# Patient Record
Sex: Male | Born: 1960 | Race: Black or African American | Hispanic: No | Marital: Single
Health system: Midwestern US, Community
[De-identification: ages and names within clinical notes are randomized; demographics above are authoritative.]

## PROBLEM LIST (undated history)

## (undated) DIAGNOSIS — I10 Essential (primary) hypertension: Secondary | ICD-10-CM

---

## 2015-03-29 ENCOUNTER — Ambulatory Visit: Admit: 2015-03-29 | Discharge: 2015-03-29 | Attending: Internal Medicine

## 2015-03-29 DIAGNOSIS — I10 Essential (primary) hypertension: Secondary | ICD-10-CM

## 2015-03-29 LAB — AMB POC LIPID PROFILE
Cholesterol (POC): 147
HDL Cholesterol (POC): 26
LDL Cholesterol (POC): 66
Non-HDL Goal (POC): 121
TChol/HDL Ratio (POC): 5.7
Triglycerides (POC): 275

## 2015-03-29 LAB — AMB POC GLUCOSE BLOOD, BY GLUCOSE MONITORING DEVICE: Glucose POC: 114 mg/dL

## 2015-03-29 MED ORDER — HYDROCHLOROTHIAZIDE 25 MG TAB
25 mg | ORAL_TABLET | Freq: Every day | ORAL | 12 refills | Status: AC
Start: 2015-03-29 — End: ?

## 2015-03-29 MED ORDER — AMLODIPINE 5 MG TAB
5 mg | ORAL_TABLET | Freq: Every day | ORAL | 12 refills | Status: AC
Start: 2015-03-29 — End: ?

## 2015-03-29 NOTE — Patient Instructions (Signed)
MyChart Activation    Thank you for requesting access to MyChart. Please follow the instructions below to securely access and download your online medical record. MyChart allows you to send messages to your doctor, view your test results, renew your prescriptions, schedule appointments, and more.    How Do I Sign Up?    1. In your internet browser, go to [email or web address]  2. Click on the First Time User? Click Here link in the Sign In box. You will be redirect to the New Member Sign Up page.  3. Enter your MyChart Access Code exactly as it appears below. You will not need to use this code after you???ve completed the sign-up process. If you do not sign up before the expiration date, you must request a new code.    MyChart Access Code: [email or web address]  Expires: 06/27/2015  8:20 AM (This is the date your MyChart access code will expire)    4. Enter the last four digits of your Social Security Number (xxxx) and Date of Birth (mm/dd/yyyy) as indicated and click Submit. You will be taken to the next sign-up page.  5. Create a MyChart ID. This will be your MyChart login ID and cannot be changed, so think of one that is secure and easy to remember.  6. Create a MyChart password. You can change your password at any time.  7. Enter your Password Reset Question and Answer. This can be used at a later time if you forget your password.   8. Enter your e-mail address. You will receive e-mail notification when new information is available in MyChart.  9. Click Sign Up. You can now view and download portions of your medical record.  10. Click the Download Summary menu link to download a portable copy of your medical information.    Additional Information    If you have questions, please visit the Frequently Asked Questions section of the MyChart website at [email or web address] Remember, MyChart is NOT to be used for urgent needs. For medical emergencies, dial 911.

## 2015-03-29 NOTE — Progress Notes (Signed)
Alexander Sellers is a 54 y.o. male and presents with Establish Care and Hypertension  .  Subjective:  Hypertension Review:  The patient has hypertension,he has headaches after taking lisinopril.  Diet and Lifestyle: generally follows a  low sodium diet, exercises sporadically  Home BP Monitoring: is not measured at home.  Pertinent ROS: taking medications as instructed, no medication side effects noted, no TIA's, no chest pain on exertion, no dyspnea on exertion, no swelling of ankles.         Review of Systems  Constitutional: negative for fevers, chills, anorexia and weight loss  Eyes:   negative for visual disturbance and irritation  ENT:   negative for tinnitus,sore throat,nasal congestion,ear pains.hoarseness  Respiratory:  negative for cough, hemoptysis, dyspnea,wheezing  CV:   negative for chest pain, palpitations, lower extremity edema  GI:   negative for nausea, vomiting, diarrhea, abdominal pain,melena  Endo:               negative for polyuria,polydipsia,polyphagia,heat intolerance  Genitourinary: negative for frequency, dysuria and hematuria  Integument:  negative for rash and pruritus  Hematologic:  negative for easy bruising and gum/nose bleeding  Musculoskel: negative for myalgias, arthralgias, back pain, muscle weakness, joint pain  Neurological:  negative for, dizziness, vertigo, memory problems and gait   Behavl/Psych: negative for feelings of anxiety, depression, mood changes    Past Medical History   Diagnosis Date   ??? Hypertension      History reviewed. No pertinent past surgical history.  Social History     Social History   ??? Marital status: SINGLE     Spouse name: N/A   ??? Number of children: N/A   ??? Years of education: N/A     Social History Main Topics   ??? Smoking status: Current Every Day Smoker   ??? Smokeless tobacco: None   ??? Alcohol use Yes      Comment: occ   ??? Drug use: None   ??? Sexual activity: Yes     Partners: Female     Other Topics Concern   ??? None     Social History Narrative    ??? None     Family History   Problem Relation Age of Onset   ??? Diabetes Mother      Current Outpatient Prescriptions   Medication Sig Dispense Refill   ??? amLODIPine (NORVASC) 5 mg tablet Take 1 Tab by mouth daily. 30 Tab 12   ??? hydrochlorothiazide (HYDRODIURIL) 25 mg tablet Take 1 Tab by mouth daily. 30 Tab 12     Not on File    Objective:  Visit Vitals   ??? BP 160/90   ??? Pulse 68   ??? Temp 98 ??F (36.7 ??C) (Oral)   ??? Resp 16   ??? Ht 6\' 2"  (1.88 m)   ??? Wt 274 lb (124.3 kg)   ??? SpO2 96%   ??? BMI 35.18 kg/m2     Physical Exam:   General appearance - alert, well appearing, and in no distress  Mental status - alert, oriented to person, place, and time  EYE-PERLA, EOMI, corneas normal, no foreign bodies  ENT-ENT exam normal, no neck nodes or sinus tenderness  Nose - normal and patent, no erythema, discharge or polyps  Mouth - mucous membranes moist, pharynx normal without lesions  Neck - supple, no significant adenopathy   Chest - clear to auscultation, no wheezes, rales or rhonchi, symmetric air entry   Heart - normal rate, regular rhythm,  normal S1, S2, no murmurs, rubs, clicks or gallops   Abdomen - soft, nontender, nondistended, no masses or organomegaly  Lymph- no adenopathy palpable  Ext-peripheral pulses normal, no pedal edema, no clubbing or cyanosis  Skin-Warm and dry. no hyperpigmentation, vitiligo, or suspicious lesions  Neuro -alert, oriented, normal speech, no focal findings or movement disorder noted  Neck-normal C-spine, no tenderness, full ROM without pain      Results for orders placed or performed in visit on 03/29/15   AMB POC GLUCOSE BLOOD, BY GLUCOSE MONITORING DEVICE   Result Value Ref Range    Glucose POC 114 mg/dL   AMB POC LIPID PROFILE   Result Value Ref Range    Cholesterol (POC) 147     Triglycerides (POC) 275     HDL Cholesterol (POC) 26     LDL Cholesterol (POC) 66     Non-HDL Goal (POC) 121     TChol/HDL Ratio (POC) 5.7        Assessment/Plan:    ICD-10-CM ICD-9-CM     1. Essential hypertension with goal blood pressure less than 140/90 I10 401.9    2. Establishing care with new doctor, encounter for Z71.89 V65.8 CBC W/O DIFF      METABOLIC PANEL, COMPREHENSIVE      AMB POC GLUCOSE BLOOD, BY GLUCOSE MONITORING DEVICE      AMB POC LIPID PROFILE     Orders Placed This Encounter   ??? CBC W/O DIFF   ??? METABOLIC PANEL, COMPREHENSIVE   ??? AMB POC GLUCOSE BLOOD, BY GLUCOSE MONITORING DEVICE   ??? AMB POC LIPID PROFILE   ??? DISCONTD: lisinopril (PRINIVIL, ZESTRIL) 10 mg tablet     Sig: Take  by mouth daily.   ??? amLODIPine (NORVASC) 5 mg tablet     Sig: Take 1 Tab by mouth daily.     Dispense:  30 Tab     Refill:  12   ??? hydrochlorothiazide (HYDRODIURIL) 25 mg tablet     Sig: Take 1 Tab by mouth daily.     Dispense:  30 Tab     Refill:  12     lose weight, increase physical activity, follow low fat diet, follow low salt diet, continue present plan,Take  aspirin daily  Patient Instructions   MyChart Activation    Thank you for requesting access to MyChart. Please follow the instructions below to securely access and download your online medical record. MyChart allows you to send messages to your doctor, view your test results, renew your prescriptions, schedule appointments, and more.    How Do I Sign Up?    1. In your internet browser, go to [email or web address]  2. Click on the First Time User? Click Here link in the Sign In box. You will be redirect to the New Member Sign Up page.  3. Enter your MyChart Access Code exactly as it appears below. You will not need to use this code after you???ve completed the sign-up process. If you do not sign up before the expiration date, you must request a new code.    MyChart Access Code: [email or web address]  Expires: 06/27/2015  8:20 AM (This is the date your MyChart access code will expire)    4. Enter the last four digits of your Social Security Number (xxxx) and Date of Birth (mm/dd/yyyy) as indicated and click Submit. You will be  taken to the next sign-up page.  5. Create a MyChart ID. This will be your MyChart login ID and cannot be  changed, so think of one that is secure and easy to remember.  6. Create a MyChart password. You can change your password at any time.  7. Enter your Password Reset Question and Answer. This can be used at a later time if you forget your password.   8. Enter your e-mail address. You will receive e-mail notification when new information is available in MyChart.  9. Click Sign Up. You can now view and download portions of your medical record.  10. Click the Download Summary menu link to download a portable copy of your medical information.    Additional Information    If you have questions, please visit the Frequently Asked Questions section of the MyChart website at [email or web address] Remember, MyChart is NOT to be used for urgent needs. For medical emergencies, dial 911.           Follow-up Disposition:  Return in about 4 weeks (around 04/26/2015), or if symptoms worsen or fail to improve.      I have reviewed with the patient details of the assessment and plan and all questions were answered. Relevent patient education was performed    An After Visit Summary was printed and given to the patient.

## 2015-03-30 LAB — METABOLIC PANEL, COMPREHENSIVE
A-G Ratio: 1.2 (ref 1.1–2.5)
ALT (SGPT): 64 IU/L — ABNORMAL HIGH (ref 0–44)
AST (SGOT): 59 IU/L — ABNORMAL HIGH (ref 0–40)
Albumin: 4 g/dL (ref 3.5–5.5)
Alk. phosphatase: 102 IU/L (ref 39–117)
BUN/Creatinine ratio: 14 (ref 9–20)
BUN: 17 mg/dL (ref 6–24)
Bilirubin, total: 0.4 mg/dL (ref 0.0–1.2)
CO2: 29 mmol/L (ref 18–29)
Calcium: 8.9 mg/dL (ref 8.7–10.2)
Chloride: 101 mmol/L (ref 97–108)
Creatinine: 1.23 mg/dL (ref 0.76–1.27)
GFR est AA: 77 mL/min/{1.73_m2} (ref >59)
GFR est non-AA: 67 mL/min/{1.73_m2} (ref >59)
GLOBULIN, TOTAL: 3.3 g/dL (ref 1.5–4.5)
Glucose: 109 mg/dL — ABNORMAL HIGH (ref 65–99)
Potassium: 4.4 mmol/L (ref 3.5–5.2)
Protein, total: 7.3 g/dL (ref 6.0–8.5)
Sodium: 141 mmol/L (ref 134–144)

## 2015-03-30 LAB — CBC W/O DIFF
HCT: 47.7 % (ref 37.5–51.0)
HGB: 16.1 g/dL (ref 12.6–17.7)
MCH: 33.1 pg — ABNORMAL HIGH (ref 26.6–33.0)
MCHC: 33.8 g/dL (ref 31.5–35.7)
MCV: 98 fL — ABNORMAL HIGH (ref 79–97)
PLATELET: 133 10*3/uL — ABNORMAL LOW (ref 150–379)
RBC: 4.87 x10E6/uL (ref 4.14–5.80)
RDW: 13.9 % (ref 12.3–15.4)
WBC: 5.6 10*3/uL (ref 3.4–10.8)

## 2015-05-01 ENCOUNTER — Encounter: Attending: Internal Medicine

## 2015-06-13 ENCOUNTER — Ambulatory Visit: Admit: 2015-06-13 | Discharge: 2015-06-13 | Attending: Internal Medicine

## 2015-06-13 DIAGNOSIS — M12812 Other specific arthropathies, not elsewhere classified, left shoulder: Secondary | ICD-10-CM

## 2015-06-13 MED ORDER — TADALAFIL 20 MG TABLET
20 mg | ORAL_TABLET | ORAL | 12 refills | Status: DC | PRN
Start: 2015-06-13 — End: 2015-06-13

## 2015-06-13 MED ORDER — TRIAMCINOLONE ACETONIDE 40 MG/ML SUSP FOR INJECTION
40 mg/mL | Freq: Once | INTRAMUSCULAR | 0 refills | Status: AC
Start: 2015-06-13 — End: 2015-06-13

## 2015-06-13 MED ORDER — TADALAFIL 20 MG TABLET
20 mg | ORAL_TABLET | ORAL | 12 refills | Status: AC | PRN
Start: 2015-06-13 — End: ?

## 2015-06-13 NOTE — Patient Instructions (Signed)
MyChart Activation    Thank you for requesting access to MyChart. Please follow the instructions below to securely access and download your online medical record. MyChart allows you to send messages to your doctor, view your test results, renew your prescriptions, schedule appointments, and more.    How Do I Sign Up?    1. In your internet browser, go to [email or web address]  2. Click on the First Time User? Click Here link in the Sign In box. You will be redirect to the New Member Sign Up page.  3. Enter your MyChart Access Code exactly as it appears below. You will not need to use this code after you???ve completed the sign-up process. If you do not sign up before the expiration date, you must request a new code.    MyChart Access Code: [email or web address]  Expires: 06/27/2015  7:20 AM (This is the date your MyChart access code will expire)    4. Enter the last four digits of your Social Security Number (xxxx) and Date of Birth (mm/dd/yyyy) as indicated and click Submit. You will be taken to the next sign-up page.  5. Create a MyChart ID. This will be your MyChart login ID and cannot be changed, so think of one that is secure and easy to remember.  6. Create a MyChart password. You can change your password at any time.  7. Enter your Password Reset Question and Answer. This can be used at a later time if you forget your password.   8. Enter your e-mail address. You will receive e-mail notification when new information is available in MyChart.  9. Click Sign Up. You can now view and download portions of your medical record.  10. Click the Download Summary menu link to download a portable copy of your medical information.    Additional Information    If you have questions, please visit the Frequently Asked Questions section of the MyChart website at [email or web address] Remember, MyChart is NOT to be used for urgent needs. For medical emergencies, dial 911.

## 2015-06-13 NOTE — Progress Notes (Signed)
Alexander Sellers is a 54 y.o. male and presents with Results and Labs  .  Subjective:  Hypertension Review:  The patient has hypertension  Diet and Lifestyle: generally follows a  low sodium diet, exercises sporadically  Home BP Monitoring: is not measured at home.  Pertinent ROS: taking medications as instructed, no medication side effects noted, no TIA's, no chest pain on exertion, no dyspnea on exertion, no swelling of ankles.     His labs revealed elevated liver enzyme elevation.    Erectile dysfunction Review::  Patient complains of difficulty in maintaining an adequate erection.  He states that his present medication is helping thus far.    Shoulder Pain Review:  Patient complains of left side shoulder pain. The symptoms began months ago Course of symptoms since onset has been gradually worsening. Pain is described as overall severity = moderate. Symptoms were incited by no known event.  Patient denies N/A. Therapy to date includes OTC analgesics: effective.      Review of Systems  Constitutional: negative for fevers, chills, anorexia and weight loss  Eyes:   negative for visual disturbance and irritation  ENT:   negative for tinnitus,sore throat,nasal congestion,ear pains.hoarseness  Respiratory:  negative for cough, hemoptysis, dyspnea,wheezing  CV:   negative for chest pain, palpitations, lower extremity edema  GI:   negative for nausea, vomiting, diarrhea, abdominal pain,melena  Endo:               negative for polyuria,polydipsia,polyphagia,heat intolerance  Genitourinary: negative for frequency, dysuria and hematuria  Integument:  negative for rash and pruritus  Hematologic:  negative for easy bruising and gum/nose bleeding  Musculoskel:  myalgias,joint pain  Neurological:  negative for headaches, dizziness, vertigo, memory problems and gait   Behavl/Psych: negative for feelings of anxiety, depression, mood changes    Past Medical History   Diagnosis Date   ??? Hypertension       History reviewed. No pertinent past surgical history.  Social History     Social History   ??? Marital status: SINGLE     Spouse name: N/A   ??? Number of children: N/A   ??? Years of education: N/A     Social History Main Topics   ??? Smoking status: Current Every Day Smoker   ??? Smokeless tobacco: None   ??? Alcohol use Yes      Comment: occ   ??? Drug use: None   ??? Sexual activity: Yes     Partners: Female     Other Topics Concern   ??? None     Social History Narrative     Family History   Problem Relation Age of Onset   ??? Diabetes Mother      Current Outpatient Prescriptions   Medication Sig Dispense Refill   ??? tadalafil (CIALIS) 20 mg tablet Take 1 Tab by mouth as needed. 10 Tab 12   ??? triamcinolone acetonide (KENALOG-40) 40 mg/mL injection 1 mL by IntraBURSal route once for 1 dose. 1 Vial 0   ??? amLODIPine (NORVASC) 5 mg tablet Take 1 Tab by mouth daily. 30 Tab 12   ??? hydrochlorothiazide (HYDRODIURIL) 25 mg tablet Take 1 Tab by mouth daily. 30 Tab 12     Not on File    Objective:  Visit Vitals   ??? BP 130/82   ??? Pulse 69   ??? Temp 98 ??F (36.7 ??C) (Oral)   ??? Resp 16   ??? Ht '6\' 2"'  (1.88 m)   ??? Wt 291  lb (132 kg)   ??? SpO2 95%   ??? BMI 37.36 kg/m2     Physical Exam:   General appearance - alert, well appearing, and in no distress  Mental status - alert, oriented to person, place, and time  EYE-PERLA, EOMI, corneas normal, no foreign bodies  ENT-ENT exam normal, no neck nodes or sinus tenderness  Nose - normal and patent, no erythema, discharge or polyps  Mouth - mucous membranes moist, pharynx normal without lesions  Neck - supple, no significant adenopathy   Chest - clear to auscultation, no wheezes, rales or rhonchi, symmetric air entry   Heart - normal rate, regular rhythm, normal S1, S2, no murmurs, rubs, clicks or gallops   Abdomen - soft, nontender, nondistended, no masses or organomegaly  Lymph- no adenopathy palpable  Ext-peripheral pulses normal, no pedal edema, no clubbing or cyanosis   Skin-Warm and dry. no hyperpigmentation, vitiligo, or suspicious lesions  Neuro -alert, oriented, normal speech, no focal findings or movement disorder noted  Neck-normal C-spine, no tenderness, full ROM without pain  Lt.shoulder-reduced range of motion of lt., subdeltoid tenderness    Results for orders placed or performed in visit on 03/29/15   CBC W/O DIFF   Result Value Ref Range    WBC 5.6 3.4 - 10.8 x10E3/uL    RBC 4.87 4.14 - 5.80 x10E6/uL    HGB 16.1 12.6 - 17.7 g/dL    HCT 47.7 37.5 - 51.0 %    MCV 98 (H) 79 - 97 fL    MCH 33.1 (H) 26.6 - 33.0 pg    MCHC 33.8 31.5 - 35.7 g/dL    RDW 13.9 12.3 - 15.4 %    PLATELET 133 (L) 150 - 379 G31D1/VO   METABOLIC PANEL, COMPREHENSIVE   Result Value Ref Range    Glucose 109 (H) 65 - 99 mg/dL    BUN 17 6 - 24 mg/dL    Creatinine 1.23 0.76 - 1.27 mg/dL    GFR est non-AA 67 >59 mL/min/1.73    GFR est AA 77 >59 mL/min/1.73    BUN/Creatinine ratio 14 9 - 20    Sodium 141 134 - 144 mmol/L    Potassium 4.4 3.5 - 5.2 mmol/L    Chloride 101 97 - 108 mmol/L    CO2 29 18 - 29 mmol/L    Calcium 8.9 8.7 - 10.2 mg/dL    Protein, total 7.3 6.0 - 8.5 g/dL    Albumin 4.0 3.5 - 5.5 g/dL    GLOBULIN, TOTAL 3.3 1.5 - 4.5 g/dL    A-G Ratio 1.2 1.1 - 2.5    Bilirubin, total 0.4 0.0 - 1.2 mg/dL    Alk. phosphatase 102 39 - 117 IU/L    AST 59 (H) 0 - 40 IU/L    ALT 64 (H) 0 - 44 IU/L   AMB POC GLUCOSE BLOOD, BY GLUCOSE MONITORING DEVICE   Result Value Ref Range    Glucose POC 114 mg/dL   AMB POC LIPID PROFILE   Result Value Ref Range    Cholesterol (POC) 147     Triglycerides (POC) 275     HDL Cholesterol (POC) 26     LDL Cholesterol (POC) 66     Non-HDL Goal (POC) 121     TChol/HDL Ratio (POC) 5.7        Assessment/Plan:    ICD-10-CM ICD-9-CM    1. Rotator cuff arthropathy, left M12.812 716.81 PR DRAIN/INJECT INTERMEDIATE JOINT/BURSA      triamcinolone  acetonide (KENALOG-40) 40 mg/mL injection   2. Screen for colon cancer Z12.11 V76.51 OCCULT BLOOD, STOOL    3. Essential hypertension I10 401.9    4. Elevated liver enzymes R74.8 790.5 HEPATITIS C AB   5. 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 OCCULT BLOOD, STOOL   ??? HEPATITIS C AB   ??? PR DRAIN/INJECT INTERMEDIATE JOINT/BURSA   ??? DISCONTD: tadalafil (CIALIS) 20 mg tablet     Sig: Take 1 Tab by mouth as needed.     Dispense:  10 Tab     Refill:  12   ??? tadalafil (CIALIS) 20 mg tablet     Sig: Take 1 Tab by mouth as needed.     Dispense:  10 Tab     Refill:  12   ??? triamcinolone acetonide (KENALOG-40) 40 mg/mL injection     Sig: 1 mL by IntraBURSal route once for 1 dose.     Dispense:  1 Vial     Refill:  0     lose weight, increase physical activity, follow low fat diet, follow low salt diet,Take 72m aspirin daily  Indications:   Symptomatic relief of pain    Procedure:  After consent was obtained, using sterile technique the left shoulder joint was prepped using alcohol. Local anesthetic used: 1% lidocaine.. The joint was entered and Kenalog 40 mg was mixed with 1% lidocaine 3 ml  and injected into the joint and the needle withdrawn.  The procedure was well tolerated.  The patient is asked to continue to rest the joint for a few more days before resuming regular activities.  It may be more painful for the first 1-2 days.  Watch for fever, or increased swelling or persistent pain in the joint. Call or return to clinic prn if such symptoms occur or there is failure to improve as anticipated.      [facility]  OFFICE PROCEDURE PROGRESS NOTE        Chart reviewed for the following:   I, HHelayne Seminole, MD, have reviewed the History, Physical and [facility]    TIME OUT performed immediately prior to start of procedure:   I, HHelayne Seminole, MD, have performed the following reviews on ZBobbye Mortonprior to the start of the procedure:             * Patient was identified by name and date of birth   * Agreement on procedure being performed was verified  * Risks and Benefits explained to the patient  * Procedure site verified and marked as necessary  * Patient was positioned for comfort       Time: 3:13pm      Date of procedure: 06/13/2015    Procedure performed by:  HHelayne Seminole, MD    Patient assisted by: self    How tolerated by patient: tolerated the procedure well with no complications    Comments: none                Patient Instructions   MyChart Activation    Thank you for requesting access to MyChart. Please follow the instructions below to securely access and download your online medical record. MyChart allows you to send messages to your doctor, view your test results, renew your prescriptions, schedule appointments, and more.  How Do I Sign Up?    1. In your internet browser, go to [email or web address]  2. Click on the First Time User? Click Here link in the Sign In box. You will be redirect to the New Member Sign Up page.  3. Enter your MyChart Access Code exactly as it appears below. You will not need to use this code after you???ve completed the sign-up process. If you do not sign up before the expiration date, you must request a new code.    MyChart Access Code: [email or web address]  Expires: 06/27/2015  7:20 AM (This is the date your MyChart access code will expire)    4. Enter the last four digits of your Social Security Number (xxxx) and Date of Birth (mm/dd/yyyy) as indicated and click Submit. You will be taken to the next sign-up page.  5. Create a MyChart ID. This will be your MyChart login ID and cannot be changed, so think of one that is secure and easy to remember.  6. Create a MyChart password. You can change your password at any time.  7. Enter your Password Reset Question and Answer. This can be used at a later time if you forget your password.   8. Enter your e-mail address. You will receive e-mail notification when  new information is available in [place].  9. Click Sign Up. You can now view and download portions of your medical record.  10. Click the Download Summary menu link to download a portable copy of your medical information.    Additional Information    If you have questions, please visit the Frequently Asked Questions section of the MyChart website at [email or web address] Remember, MyChart is NOT to be used for urgent needs. For medical emergencies, dial 911.           Follow-up Disposition:  Return in about 4 weeks (around 07/11/2015), or if symptoms worsen or fail to improve.      I have reviewed with the patient details of the assessment and plan and all questions were answered. Relevent patient education was performed    An After Visit Summary was printed and given to the patient.

## 2015-06-13 NOTE — Addendum Note (Signed)
Addended by: Wyvonnia LoraWYATT, Madell Heino D on: 06/13/2015 04:28 PM      Modules accepted: Orders

## 2015-06-14 LAB — HEPATITIS C ANTIBODY: HCV Ab: >11 s/co ratio — ABNORMAL HIGH (ref 0.0–0.9)

## 2015-06-14 LAB — HEPATITIS C AB: Hep C Virus Ab: >11 s/co ratio — ABNORMAL HIGH (ref 0.0–0.9)

## 2015-06-16 LAB — LIPID PANEL
Cholesterol, total: 143 mg/dL (ref 100–199)
HDL Cholesterol: 13 mg/dL — ABNORMAL LOW (ref >39)
Triglyceride: 732 mg/dL — CR (ref 0–149)

## 2015-06-16 LAB — SPECIMEN STATUS REPORT

## 2015-06-16 LAB — CVD REPORT

## 2015-06-26 LAB — OCCULT BLOOD IMMUNOASSAY,DIAGNOSTIC: Occult blood fecal, by IA: NEGATIVE

## 2015-08-24 ENCOUNTER — Encounter: Attending: Internal Medicine

## 2016-06-19 ENCOUNTER — Emergency Department
Admission: EM | Admit: 2016-06-19 | Discharge: 2016-06-19 | Disposition: A | Discharge status: Home / Self Care | Payer: 59 | Attending: Emergency Medicine | Admitting: Emergency Medicine

## 2016-06-19 ENCOUNTER — Encounter: Payer: Self-pay | Admitting: *Deleted

## 2016-06-19 ENCOUNTER — Emergency Department: Payer: 59

## 2016-06-19 DIAGNOSIS — Y939 Activity, unspecified: Secondary | ICD-10-CM | POA: Insufficient documentation

## 2016-06-19 DIAGNOSIS — Y999 Unspecified external cause status: Secondary | ICD-10-CM | POA: Insufficient documentation

## 2016-06-19 DIAGNOSIS — S9032XA Contusion of left foot, initial encounter: Secondary | ICD-10-CM | POA: Diagnosis not present

## 2016-06-19 DIAGNOSIS — I10 Essential (primary) hypertension: Secondary | ICD-10-CM | POA: Diagnosis not present

## 2016-06-19 DIAGNOSIS — W228XXA Striking against or struck by other objects, initial encounter: Secondary | ICD-10-CM | POA: Diagnosis not present

## 2016-06-19 DIAGNOSIS — Y929 Unspecified place or not applicable: Secondary | ICD-10-CM | POA: Diagnosis not present

## 2016-06-19 DIAGNOSIS — S99922A Unspecified injury of left foot, initial encounter: Secondary | ICD-10-CM | POA: Diagnosis present

## 2016-06-19 HISTORY — DX: Essential (primary) hypertension: I10

## 2016-06-19 MED ORDER — MELOXICAM 7.5 MG PO TABS: 7.5000 mg | ORAL_TABLET | Freq: Every day | ORAL | 2 refills | Status: AC

## 2016-06-19 MED ORDER — KETOROLAC TROMETHAMINE 60 MG/2ML IM SOLN
60.0000 mg | Freq: Once | INTRAMUSCULAR | Status: AC
  Administered 2016-06-19: 60 mg via INTRAMUSCULAR
  Filled 2016-06-19: qty 2
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
## 2016-06-19 NOTE — ED Notes (Signed)
Pt verbalized understanding of discharge instructions. NAD at this time. 
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
## 2023-03-23 DEATH — deceased
